# Patient Record
Sex: Male | Born: 2000 | Race: White | Hispanic: No | Marital: Single | Smoking: Never smoker
Health system: Southern US, Community
[De-identification: ages and names within clinical notes are randomized; demographics above are authoritative.]

## PROBLEM LIST (undated history)

## (undated) DIAGNOSIS — J302 Other seasonal allergic rhinitis: Secondary | ICD-10-CM

---
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
## 2021-09-07 ENCOUNTER — Other Ambulatory Visit: Payer: Self-pay

## 2021-09-07 DIAGNOSIS — M5431 Sciatica, right side: Secondary | ICD-10-CM | POA: Insufficient documentation

## 2021-09-07 NOTE — ED Triage Notes (Addendum)
Pt via POV from home c/o right leg pain x a few weeks. He went to [facility] last week and was told that he may have mild scoliosis with nerve pain, so they prescribed steroids and meloxicam and advised him to come to the ED for eval if his pain did not resolve. He has tried heat and OTC remedies with little to no relief. Currently rates pain 9/10 and describes the pain as sharp and stabbing. He appears uncomfortable in triage. Mom at bedside is assisting with history. Today he has had a total 3000mg  tylenol today along with biofreeze and 15mg  meloxicam. Last dose of 1000mg  tylenol was taken around 9pm. Pt notes his pain seems to be worse if he sits or stands for long periods of time and says it goes from the top of his gluteal muscle down to the bottom of his heel on the right side.

## 2021-09-08 ENCOUNTER — Emergency Department
Admission: EM | Admit: 2021-09-08 | Discharge: 2021-09-08 | Disposition: A | Discharge status: Home / Self Care | Payer: Commercial Managed Care - PPO | Attending: Emergency Medicine | Admitting: Emergency Medicine

## 2021-09-08 DIAGNOSIS — M5431 Sciatica, right side: Secondary | ICD-10-CM

## 2021-09-08 MED ORDER — OXYCODONE-ACETAMINOPHEN 5-325 MG PO TABS: 2.0000 | ORAL_TABLET | Freq: Three times a day (TID) | ORAL | 0 refills | Status: AC | PRN

## 2021-09-08 MED ORDER — KETOROLAC TROMETHAMINE 30 MG/ML IJ SOLN
30.0000 mg | Freq: Once | INTRAMUSCULAR | Status: AC
  Administered 2021-09-08: 02:00:00 30 mg via INTRAMUSCULAR
  Filled 2021-09-08: qty 1

## 2021-09-08 MED ORDER — OXYCODONE-ACETAMINOPHEN 5-325 MG PO TABS
2.0000 | ORAL_TABLET | Freq: Once | ORAL | Status: AC
  Administered 2021-09-08: 02:00:00 2 via ORAL
  Filled 2021-09-08: qty 2

## 2021-09-08 MED ORDER — LIDOCAINE 5 % EX PTCH: 1.0000 | MEDICATED_PATCH | Freq: Two times a day (BID) | CUTANEOUS | 0 refills | Status: AC

## 2021-09-08 NOTE — ED Provider Notes (Signed)
[facility] Emergency Department Provider Note  ____________________________________________   Event Date/Time   First MD Initiated Contact with Patient 09/08/21 0020     (approximate)  I have reviewed the triage vital signs and the nursing notes.   HISTORY  Chief Complaint Leg Pain    HPI Johnathan Powell is a 20 y.o. male who presents for evaluation of pain in his right buttock radiating all the way down his leg to his foot.  He has seen [facility] recently and was diagnosed with "nerve pain".  They prescribed a steroid taper but he was not getting much success so he called them back and they also prescribed meloxicam.  Neither seem to work.  He has also been taking acetaminophen regularly to no avail.  He said the pain is an aching and burning pain that starts in the upper part of his right buttock and radiates all the way down the back of his leg.  No weakness but any amount of moving around or changing positions seems to make him more uncomfortable.  He works with [employer] and the activity and movement, as well as periods where he has to remain still, seem to aggravate the pain.  Extending his leg out fully and straight makes it worse, nothing in particular makes it better.  He describes the pain as severe.  No recent trauma.  He denies fever, sore throat, chest pain, shortness of breath, nausea, vomiting, abdominal pain, and dysuria.  He has had no urinary urgency, urinary incontinence, nor urinary retention.  No loss of bowel control.       Past Medical History:  Diagnosis Date   Seasonal allergies     There are no problems to display for this patient.   No past surgical history on file.  Prior to Admission medications   Medication Sig Start Date End Date Taking? Authorizing Provider  lidocaine (LIDODERM) 5 % Place 1 patch onto the skin every 12 (twelve) hours. Remove & Discard patch within 12 hours or as directed by MD.  Put the patch on  the area where the pain originates.  Leave the patch off for 12 hours before applying a new one. 09/08/21 09/08/22 Yes Hinda Kehr, MD  oxyCODONE-acetaminophen (PERCOCET) 5-325 MG tablet Take 2 tablets by mouth every 8 (eight) hours as needed for severe pain. 09/08/21  Yes Hinda Kehr, MD  oxyCODONE-acetaminophen (PERCOCET) 5-325 MG tablet Take 2 tablets by mouth every 8 (eight) hours as needed for severe pain. 09/08/21  Yes Hinda Kehr, MD  loratadine (CLARITIN) 10 MG tablet Take 10 mg by mouth daily.    [provider]  loratadine (CLARITIN) 10 MG tablet Take 10 mg by mouth daily.    [provider]  trimethoprim-polymyxin b (POLYTRIM) ophthalmic solution Place 1 drop into the left eye every 4 (four) hours. 01/31/14   Kandra Nicolas, MD    Allergies Patient has no known allergies.  Family History  Problem Relation Age of Onset   Crohn's disease Mother     Social History Social History   Tobacco Use   Smoking status: Never  Substance Use Topics   Alcohol use: No   Drug use: No    Review of Systems Constitutional: No fever/chills Eyes: No visual changes. ENT: No sore throat. Cardiovascular: Denies chest pain. Respiratory: Denies shortness of breath. Gastrointestinal: No abdominal pain.  No nausea, no vomiting.  No diarrhea.  No constipation.  Negative for bowel incontinence. Genitourinary: Negative for dysuria, urinary  incontinence, and urinary retention. Musculoskeletal: Pain radiating down the top of his right buttock to his foot. Integumentary: Negative for rash. Neurological: No focal weakness.   ____________________________________________   PHYSICAL EXAM:  VITAL SIGNS: ED Triage Vitals  Enc Vitals Group     BP 09/07/21 2257 129/75     Pulse Rate 09/07/21 2257 60     Resp 09/07/21 2257 13     Temp 09/07/21 2257 97.9 F (36.6 C)     Temp Source 09/07/21 2257 Oral     SpO2 09/07/21 2257 97 %     Weight 09/07/21 2248 59 kg (130 lb)      Height 09/07/21 2248 1.778 m (5\' 10" )     Head Circumference --      Peak Flow --      Pain Score 09/07/21 2248 9     Pain Loc --      Pain Edu? --      Excl. in GC? --     Constitutional: Alert and oriented.  Appears uncomfortable but not in severe distress. Eyes: Conjunctivae are normal.  Head: Atraumatic. Nose: No congestion/rhinnorhea. Mouth/Throat: Patient is wearing a mask. Neck: No stridor.  No meningeal signs.   Cardiovascular: Normal rate, regular rhythm. Good peripheral circulation. Respiratory: Normal respiratory effort.  No retractions. Gastrointestinal: Soft and nontender. No distention.  Musculoskeletal: No lower extremity tenderness nor edema. No gross deformities of extremities. Neurologic:  Normal speech and language. No gross focal neurologic deficits are appreciated.  Worsening pain with right straight leg raise.  No focal weakness. Skin:  Skin is warm, dry and intact. Psychiatric: Mood and affect are normal. Speech and behavior are normal.  ____________________________________________    INITIAL IMPRESSION / MDM / ASSESSMENT AND PLAN / ED COURSE  As part of my medical decision making, I reviewed the following data within the electronic MEDICAL RECORD NUMBER History obtained from family, Nursing notes reviewed and incorporated, Old chart reviewed, Notes from prior ED visits, and [place] Controlled Substance Database   Differential diagnosis includes, but is not limited to, sciatica, cauda equina syndrome, nonspecific herniated disc, musculoskeletal strain, transverse myelitis, osteomyelitis/discitis.  Patient is generally well-appearing, uncomfortable but not in severe distress.  His symptoms are classic for sciatica.  No history of trauma.  No warning signs or symptoms to suggest cord compression requiring emergent surgery or cauda equina syndrome.  He has already tried NSAIDs and steroids.  He is done with prednisone.  I had my usual discussion about sciatica with the  patient and his mother who is a 09[birth year]09/21 and is at bedside.  We agreed for short course of Percocet and I am suggesting that he follow-up with Dr. Engineer, civil (consulting) with neurosurgery as well as Dr. Myer Haff with physiatry.  I gave my usual and customary management recommendations and return precautions and he and his mother understand and agree with the plan.              ____________________________________________  FINAL CLINICAL IMPRESSION(S) / ED DIAGNOSES  Final diagnoses:  Sciatica of right side     MEDICATIONS GIVEN DURING THIS VISIT:  Medications  oxyCODONE-acetaminophen (PERCOCET/ROXICET) 5-325 MG per tablet 2 tablet (2 tablets Oral Given 09/08/21 0208)  ketorolac (TORADOL) 30 MG/ML injection 30 mg (30 mg Intramuscular Given 09/08/21 0209)     ED Discharge Orders          Ordered    oxyCODONE-acetaminophen (PERCOCET) 5-325 MG tablet  Every 8 hours PRN        09/08/21  0118    lidocaine (LIDODERM) 5 %  Every 12 hours        09/08/21 0118    oxyCODONE-acetaminophen (PERCOCET) 5-325 MG tablet  Every 8 hours PRN        09/08/21 0218             Note:  This document was prepared using Dragon voice recognition software and may include unintentional dictation errors.   Hinda Kehr, MD 09/08/21 [phone]

## 2021-09-08 NOTE — Discharge Instructions (Signed)
You were evaluated in the Emergency Department today for back pain. Your evaluation suggests no acute abnormalities which require further intervention at this time.   - Move around as tolerated but avoiding heavy lifting. Bed rest is not recommended nor is it the best treatment for low back pain.  - Medications will help control your discomfort, but likely will not make it completely go away.  -- Do not drink alcohol, drive a car, operate machinery, or get up on ladders or heights when taking any prescribed pain medications.  -- Do not drive home if you received prescribed pain medications here in the ED.  Please follow up with your primary care physician as needed or any other providers listed in this paperwork. If you do not have a primary doctor, you can call your insurance company to find one.  If you do not have insurance, you can go to the finance/registration department for more assistance.  Return to the ED immediately if you develop any of the following problems: -- Leaking urine or difficulty urinating; -- Inability to control your bowels; -- New numbness or weakness in your legs or numbness between your legs; -- Inability to walk -- Fever

## 2021-09-12 ENCOUNTER — Other Ambulatory Visit: Payer: Self-pay | Admitting: Family Medicine

## 2021-09-12 DIAGNOSIS — M5416 Radiculopathy, lumbar region: Secondary | ICD-10-CM

## 2021-09-12 DIAGNOSIS — M5136 Other intervertebral disc degeneration, lumbar region: Secondary | ICD-10-CM

## 2021-09-19 ENCOUNTER — Ambulatory Visit
Admission: RE | Admit: 2021-09-19 | Discharge: 2021-09-19 | Disposition: A | Discharge status: Home / Self Care | Payer: Commercial Managed Care - PPO | Source: Ambulatory Visit | Attending: Family Medicine | Admitting: Family Medicine

## 2021-09-19 DIAGNOSIS — M5416 Radiculopathy, lumbar region: Secondary | ICD-10-CM

## 2021-09-19 DIAGNOSIS — M5136 Other intervertebral disc degeneration, lumbar region: Secondary | ICD-10-CM

## 2022-06-03 ENCOUNTER — Ambulatory Visit
Admission: RE | Admit: 2022-06-03 | Discharge: 2022-06-03 | Disposition: A | Discharge status: Home / Self Care | Payer: Commercial Managed Care - PPO | Source: Ambulatory Visit | Attending: Urgent Care | Admitting: Urgent Care

## 2022-06-03 VITALS — BP 110/69 | HR 65 | Temp 97.7°F | Resp 16

## 2022-06-03 DIAGNOSIS — Z20822 Contact with and (suspected) exposure to covid-19: Secondary | ICD-10-CM | POA: Diagnosis not present

## 2022-06-03 DIAGNOSIS — J069 Acute upper respiratory infection, unspecified: Secondary | ICD-10-CM | POA: Insufficient documentation

## 2022-06-03 LAB — RESP PANEL BY RT-PCR (RSV, FLU A&B, COVID)  RVPGX2
Influenza A by PCR: NEGATIVE
Influenza B by PCR: NEGATIVE
Resp Syncytial Virus by PCR: NEGATIVE
SARS Coronavirus 2 by RT PCR: NEGATIVE

## 2022-06-03 NOTE — ED Triage Notes (Signed)
Pt. States that  last night he started experiencing cough, congestion, emesis episodes. Bodyaches and a headache. Pt. Has been treating himself with Ibuprofen and no relief.

## 2022-06-03 NOTE — ED Provider Notes (Addendum)
Johnathan Powell    CSN: [id number] Arrival date & time: 06/03/22  0855      History   Chief Complaint Chief Complaint  Patient presents with   Generalized Body Aches   Cough   Headache   Nasal Congestion   apponment    Emesis    HPI Johnathan Powell is a 21 y.o. male.    Cough Associated symptoms: headaches   Headache Associated symptoms: cough and vomiting   Emesis Associated symptoms: cough and headaches     Patient presents to UC with complaint of flulike symptoms starting last night.  He endorses nasal congestion not productive of mucus and no rhinorrhea, nausea with vomiting and multiple episodes of emesis last night and this morning, body aches, severe headache.  Self treating with ibuprofen (800 mg) without relief.  Past Medical History:  Diagnosis Date   Seasonal allergies     There are no problems to display for this patient.   No past surgical history on file.     Home Medications    Prior to Admission medications   Medication Sig Start Date End Date Taking? Authorizing Provider  lidocaine (LIDODERM) 5 % Place 1 patch onto the skin every 12 (twelve) hours. Remove & Discard patch within 12 hours or as directed by MD.  Put the patch on the area where the pain originates.  Leave the patch off for 12 hours before applying a new one. 09/08/21 09/08/22  Loleta Rose, MD  loratadine (CLARITIN) 10 MG tablet Take 10 mg by mouth daily.    [provider]  loratadine (CLARITIN) 10 MG tablet Take 10 mg by mouth daily.    [provider]  oxyCODONE-acetaminophen (PERCOCET) 5-325 MG tablet Take 2 tablets by mouth every 8 (eight) hours as needed for severe pain. 09/08/21   Loleta Rose, MD  oxyCODONE-acetaminophen (PERCOCET) 5-325 MG tablet Take 2 tablets by mouth every 8 (eight) hours as needed for severe pain. 09/08/21   Loleta Rose, MD  trimethoprim-polymyxin b (POLYTRIM) ophthalmic solution Place 1 drop into the left eye every 4  (four) hours. 01/31/14   Lattie Haw, MD    Family History Family History  Problem Relation Age of Onset   Crohn's disease Mother     Social History Social History   Tobacco Use   Smoking status: Never  Substance Use Topics   Alcohol use: No   Drug use: No     Allergies   Patient has no known allergies.   Review of Systems Review of Systems  Respiratory:  Positive for cough.   Gastrointestinal:  Positive for vomiting.  Neurological:  Positive for headaches.     Physical Exam Triage Vital Signs ED Triage Vitals  Enc Vitals Group     BP      Pulse      Resp      Temp      Temp src      SpO2      Weight      Height      Head Circumference      Peak Flow      Pain Score      Pain Loc      Pain Edu?      Excl. in GC?    No data found.  Updated Vital Signs There were no vitals taken for this visit.  Visual Acuity Right Eye Distance:   Left Eye Distance:   Bilateral Distance:    Right  Eye Near:   Left Eye Near:    Bilateral Near:     Physical Exam Vitals reviewed.  Constitutional:      Appearance: He is well-developed. He is ill-appearing.  HENT:     Right Ear: Tympanic membrane normal.     Left Ear: Tympanic membrane normal.     Mouth/Throat:     Mouth: Mucous membranes are moist.     Pharynx: Posterior oropharyngeal erythema present. No oropharyngeal exudate.  Cardiovascular:     Rate and Rhythm: Normal rate and regular rhythm.  Pulmonary:     Effort: Pulmonary effort is normal.     Breath sounds: Normal breath sounds.  Abdominal:     General: Bowel sounds are normal.     Palpations: Abdomen is soft.  Skin:    General: Skin is warm and dry.  Neurological:     Mental Status: He is alert and oriented to person, place, and time.  Psychiatric:        Mood and Affect: Mood normal.        Behavior: Behavior normal.      UC Treatments / Results  Labs (all labs ordered are listed, but only abnormal results are displayed) Labs  Reviewed - No data to display  EKG   Radiology No results found.  Procedures Procedures (including critical care time)  Medications Ordered in UC Medications - No data to display  Initial Impression / Assessment and Plan / UC Course  I have reviewed the triage vital signs and the nursing notes.  Pertinent labs & imaging results that were available during my care of the patient were reviewed by me and considered in my medical decision making (see chart for details).   Suspect viral URI.  Flu/COVID/RSV swab was obtained and pending.  Ill appearing.  Physical exam is remarkable only for erythematous pharynx.  Recommended treatment with OTC medication for symptom control.  Encouraged him to use a lower dose of ibuprofen to avoid irritation to his stomach.  Final Clinical Impressions(s) / UC Diagnoses   Final diagnoses:  None   Discharge Instructions   None    ED Prescriptions   None    PDMP not reviewed this encounter.   Rose Phi, [place] 06/03/22 [place], [place], [place] 06/03/22 [id number]

## 2022-06-03 NOTE — Discharge Instructions (Addendum)
Follow-up here or with your primary care provider if your symptoms worsen or do not resolve within 7 days.

## 2022-07-18 IMAGING — MR MR LUMBAR SPINE W/O CM
5 series · 38 of 48 positions shown · non-contrast
Comparison: None.

CLINICAL DATA: 20-year-old male with low back pain radiating to the
right leg.

EXAM:
MRI LUMBAR SPINE WITHOUT CONTRAST
TECHNIQUE: Multiplanar, multisequence MR imaging of the lumbar spine was
performed. No intravenous contrast was administered.

[Series 3: T1 · sagittal · 4.0mm · 0.41mm/px · 4 of 11 slices shown (1 of 2)]
[im 1/11]
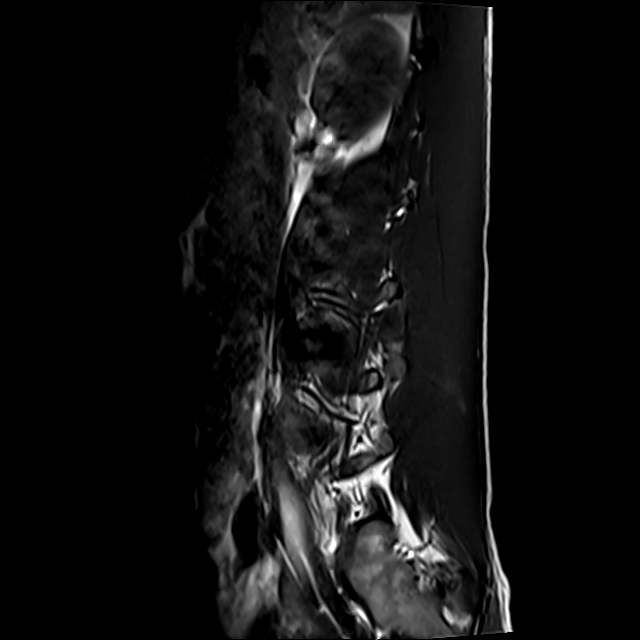
[im 4/11]
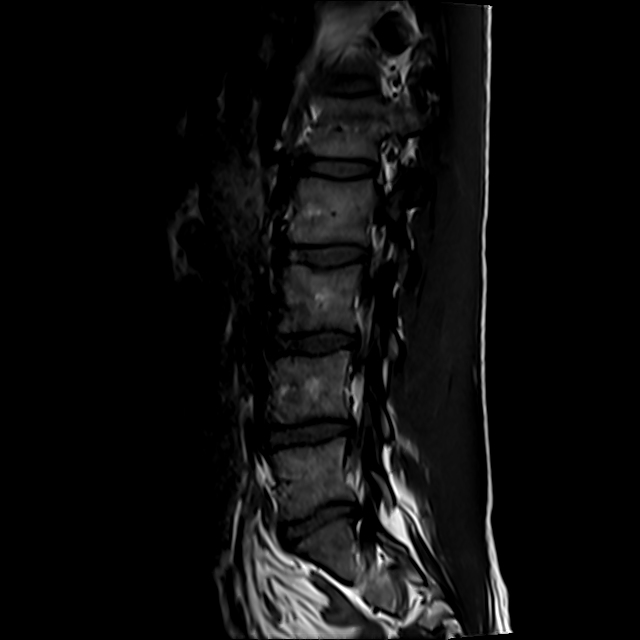
[im 7/11]
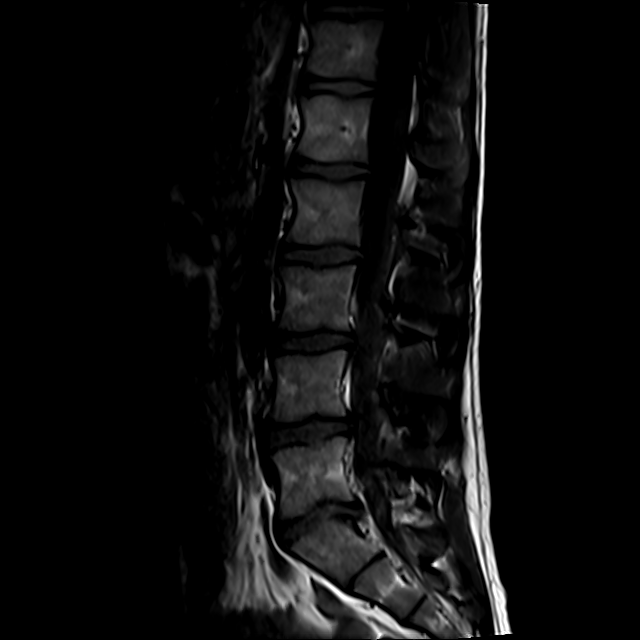
[im 11/11]
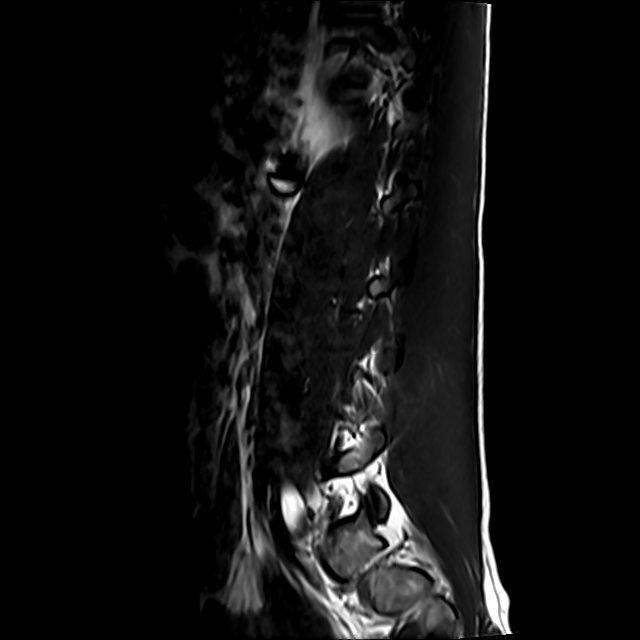

[Series 4: STIR · sagittal · 4.0mm · 0.51mm/px · 5 of 11 slices shown]
[im 1/11]
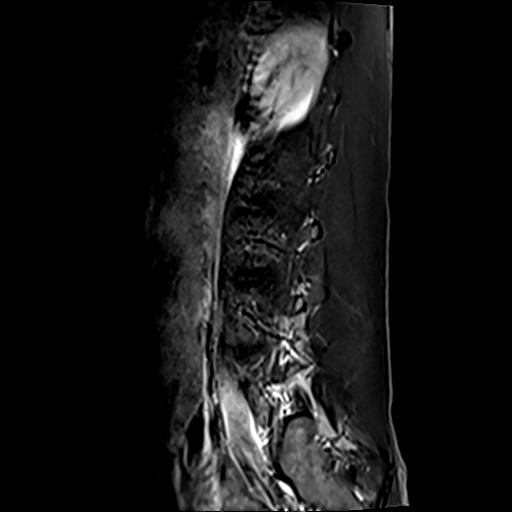
[im 3/11]
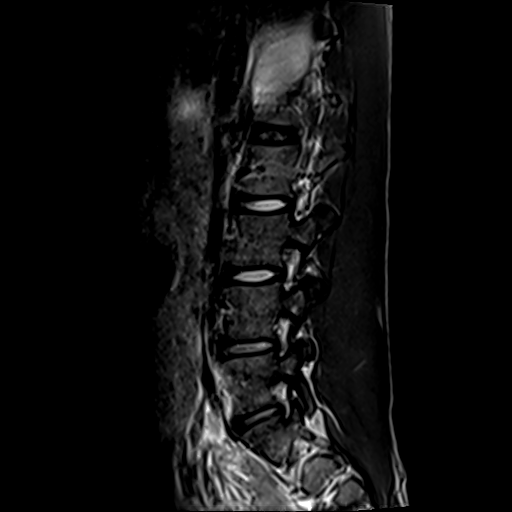
[im 6/11]
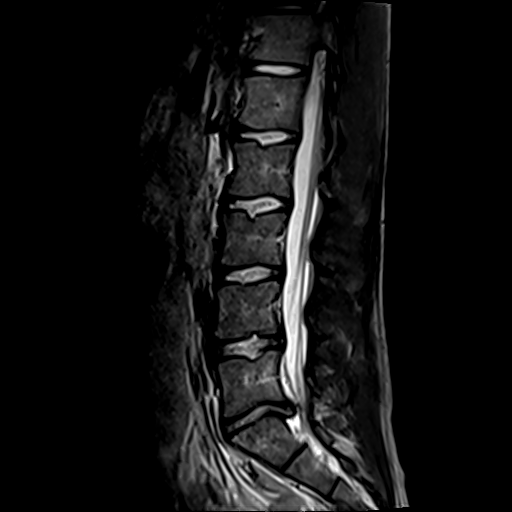
[im 8/11]
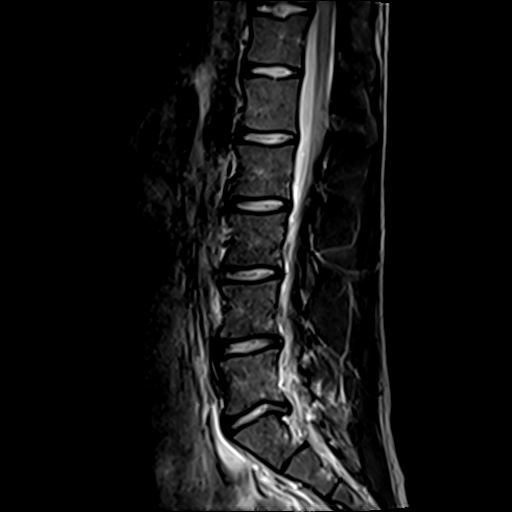
[im 11/11]
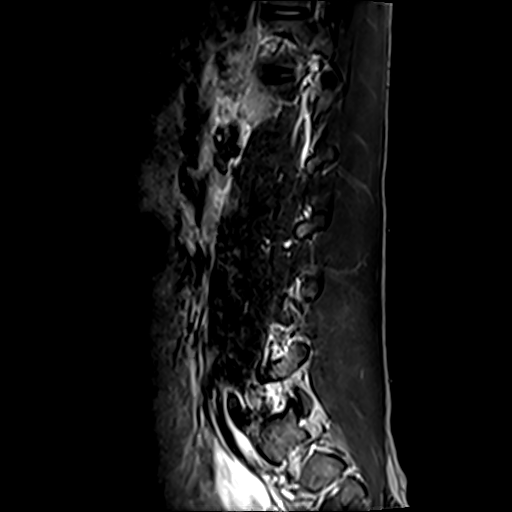

[Series 5: T2 · sagittal · 4.0mm · 1.02mm/px · 5 of 11 slices shown (1 of 2)]
[im 1/11]
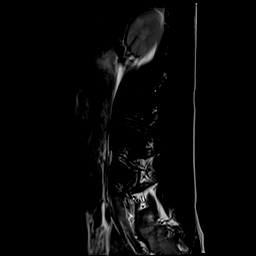
[im 3/11]
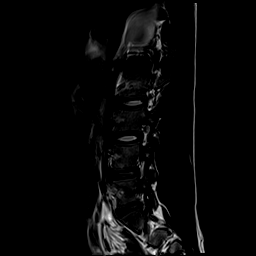
[im 6/11]
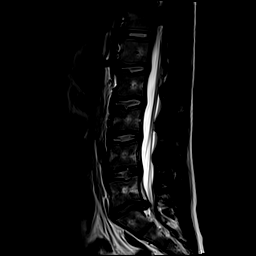
[im 8/11]
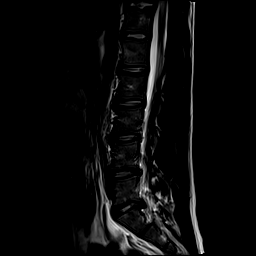
[im 11/11]
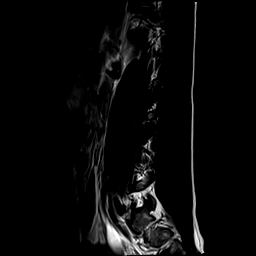

[Series 6: T1 · axial · 4.0mm · 0.78mm/px · z∈[-182,+3]mm · 11 of 41 slices shown (2 of 2)]
[im 3/41]
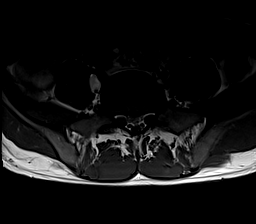
[im 6/41]
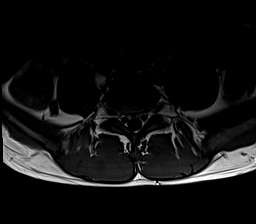
[im 8/41]
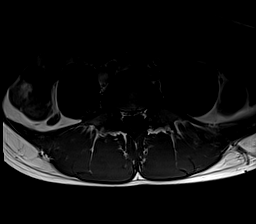
[im 13/41]
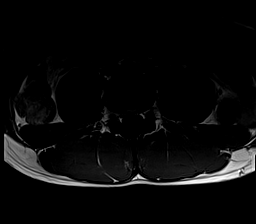
[im 18/41]
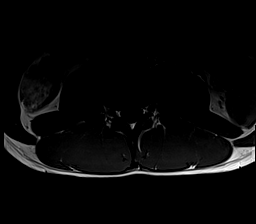
[im 21/41]
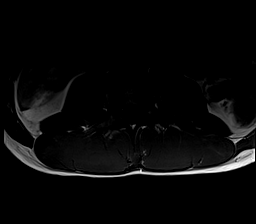
[im 23/41]
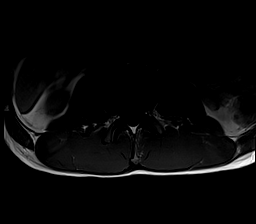
[im 28/41]
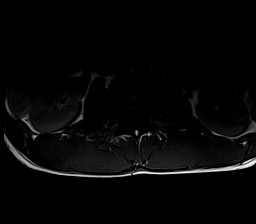
[im 33/41]
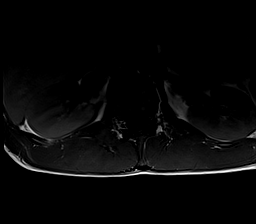
[im 36/41]
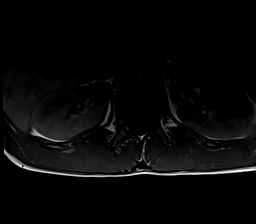
[im 38/41]
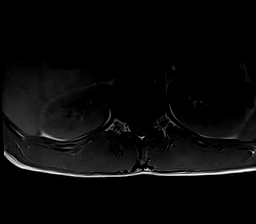

[Series 7: T2 · axial · 4.0mm · 0.78mm/px · z∈[-193,-2]mm · 13 of 39 slices shown (2 of 2)]
[im 1/39]
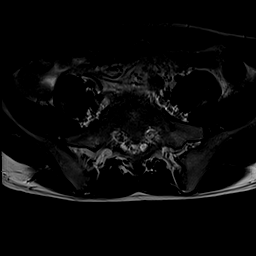
[im 3/39]
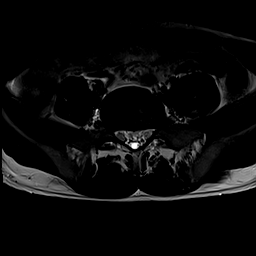
[im 5/39]
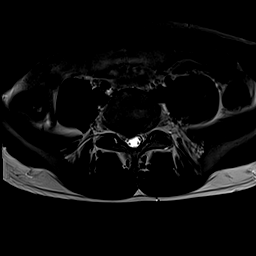
[im 8/39]
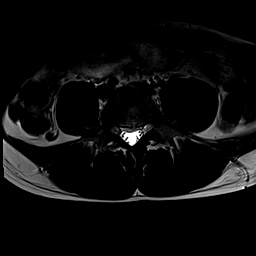
[im 10/39]
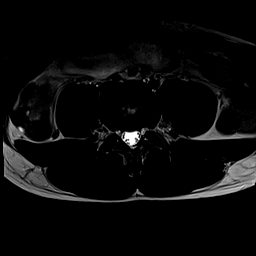
[im 12/39]
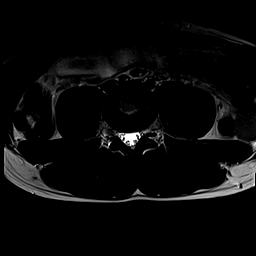
[im 17/39]
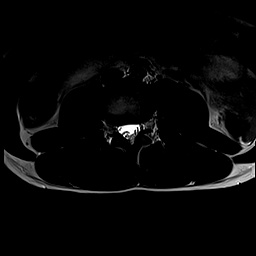
[im 20/39]
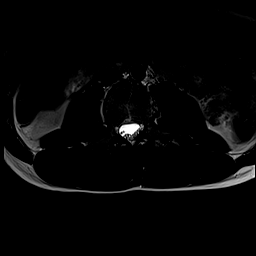
[im 22/39]
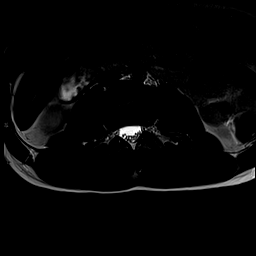
[im 27/39]
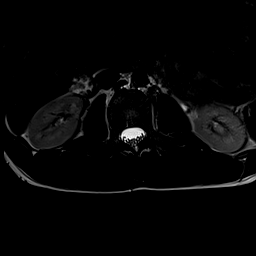
[im 31/39]
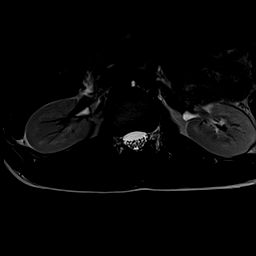
[im 34/39]
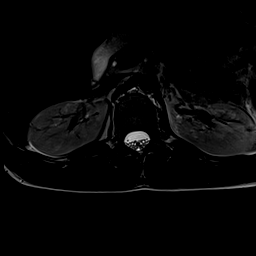
[im 36/39]
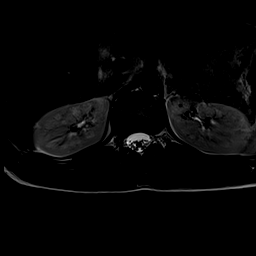

[38 of 48 positions shown; findings below may reference images not displayed]

FINDINGS: Segmentation: Lumbar segmentation appears to be normal and will be
designated as such for this report.

Alignment: Straightening of lumbar lordosis with mild dextroconvex
lumbar scoliosis. No spondylolisthesis.

Vertebrae: Visualized bone marrow signal is within normal limits. No
marrow edema or evidence of acute osseous abnormality. Intact
visible sacrum.

Conus medullaris and cauda equina: Conus extends to the T12-L1
level. No lower spinal cord or conus signal abnormality.

Paraspinal and other soft tissues: Negative.

Disc levels:

T11-T12: Partially visible, grossly negative.

T12-L1:  Negative.

L1-L2:  Negative.

L2-L3:  Negative.

L3-L4:  Negative.

L4-L5:  Negative.

L5-S1: Subtle disc space loss and disc desiccation. Broad-based
right paracentral disc protrusion most affecting the right lateral
recess (series 7, image 35, descending right S1 nerve level). Up to
moderate lateral recess stenosis but no significant spinal stenosis.
No foraminal involvement or stenosis.
IMPRESSION: Isolated lumbar disc degeneration at L5-S1 with a broad-based
rightward disc herniation affecting the right lateral recess. Query
Right S1 radiculitis.

## 2022-11-05 ENCOUNTER — Telehealth: Payer: Commercial Managed Care - PPO | Admitting: Nurse Practitioner

## 2022-11-05 DIAGNOSIS — H66002 Acute suppurative otitis media without spontaneous rupture of ear drum, left ear: Secondary | ICD-10-CM | POA: Diagnosis not present

## 2022-11-05 MED ORDER — AMOXICILLIN-POT CLAVULANATE 875-125 MG PO TABS: 1.0000 | ORAL_TABLET | Freq: Two times a day (BID) | ORAL | 0 refills | Status: AC

## 2022-11-05 MED ORDER — FLUTICASONE PROPIONATE 50 MCG/ACT NA SUSP: 2.0000 | Freq: Every day | NASAL | 6 refills | Status: AC

## 2022-11-05 NOTE — Progress Notes (Signed)
Virtual Visit Consent   Johnathan Powell, you are scheduled for a virtual visit with a [facility] provider today. Just as with appointments in the office, your consent must be obtained to participate. Your consent will be active for this visit and any virtual visit you may have with one of our providers in the next 365 days. If you have a MyChart account, a copy of this consent can be sent to you electronically.  As this is a virtual visit, video technology does not allow for your provider to perform a traditional examination. This may limit your provider's ability to fully assess your condition. If your provider identifies any concerns that need to be evaluated in person or the need to arrange testing (such as labs, EKG, etc.), we will make arrangements to do so. Although advances in technology are sophisticated, we cannot ensure that it will always work on either your end or our end. If the connection with a video visit is poor, the visit may have to be switched to a telephone visit. With either a video or telephone visit, we are not always able to ensure that we have a secure connection.  By engaging in this virtual visit, you consent to the provision of healthcare and authorize for your insurance to be billed (if applicable) for the services provided during this visit. Depending on your insurance coverage, you may receive a charge related to this service.  I need to obtain your verbal consent now. Are you willing to proceed with your visit today? Kobyn D Lunney has provided verbal consent on 11/05/2022 for a virtual visit (video or telephone). Apolonio Schneiders, FNP  Date: 11/05/2022 7:41 PM  Virtual Visit via Video Note   I, Apolonio Schneiders, connected with  Johnathan Powell  ([id number], 03/21/2001) on 11/05/22 at  7:45 PM EST by a video-enabled telemedicine application and verified that I am speaking with the correct person using two identifiers.  Location: Patient: Virtual Visit Location Patient:  Home Provider: Virtual Visit Location Provider: Home Office   I discussed the limitations of evaluation and management by telemedicine and the availability of in person appointments. The patient expressed understanding and agreed to proceed.    History of Present Illness: Johnathan Powell is a 22 y.o. who identifies as a male who was assigned male at birth, and is being seen today for ongoing URI symptoms.  He was in seen in office last week, tested negative for flu and COVID but was treated for flu due to high suspicion for flu   He has persisted with sore throat and ear pain/pressure  Left ear pain is the worse today   Mild runny nose  Denies a recent fever   He has been using Excedrin for pain relief He has had ear infections in the past on average one a year     Problems: There are no problems to display for this patient.   Allergies: No Known Allergies Medications: None  Observations/Objective: Patient is well-developed, well-nourished in no acute distress.  Resting comfortably  at home.  Head is normocephalic, atraumatic.  No labored breathing.  Speech is clear and coherent with logical content.  Patient is alert and oriented at baseline.    Assessment and Plan: 1. Non-recurrent acute suppurative otitis media of left ear without spontaneous rupture of tympanic membrane May continue pain medications as needed, if no improvement in 48 hours please schedule follow up  - amoxicillin-clavulanate (AUGMENTIN) 875-125 MG tablet; Take 1 tablet by mouth  2 (two) times daily for 7 days. Take with food  Dispense: 14 tablet; Refill: 0 - fluticasone (FLONASE) 50 MCG/ACT nasal spray; Place 2 sprays into both nostrils daily.  Dispense: 16 g; Refill: 6     Follow Up Instructions: I discussed the assessment and treatment plan with the patient. The patient was provided an opportunity to ask questions and all were answered. The patient agreed with the plan and demonstrated an  understanding of the instructions.  A copy of instructions were sent to the patient via MyChart unless otherwise noted below.    The patient was advised to call back or seek an in-person evaluation if the symptoms worsen or if the condition fails to improve as anticipated.  Time:  I spent 9 minutes with the patient via telehealth technology discussing the above problems/concerns.    Apolonio Schneiders, FNP

## 2023-01-18 ENCOUNTER — Emergency Department
Admission: EM | Admit: 2023-01-18 | Discharge: 2023-01-18 | Disposition: A | Discharge status: Home / Self Care | Payer: Commercial Managed Care - PPO | Attending: Emergency Medicine | Admitting: Emergency Medicine

## 2023-01-18 ENCOUNTER — Emergency Department: Payer: Commercial Managed Care - PPO

## 2023-01-18 DIAGNOSIS — J209 Acute bronchitis, unspecified: Secondary | ICD-10-CM | POA: Insufficient documentation

## 2023-01-18 DIAGNOSIS — D72829 Elevated white blood cell count, unspecified: Secondary | ICD-10-CM | POA: Insufficient documentation

## 2023-01-18 DIAGNOSIS — J40 Bronchitis, not specified as acute or chronic: Secondary | ICD-10-CM

## 2023-01-18 DIAGNOSIS — Z716 Tobacco abuse counseling: Secondary | ICD-10-CM | POA: Insufficient documentation

## 2023-01-18 DIAGNOSIS — R062 Wheezing: Secondary | ICD-10-CM

## 2023-01-18 DIAGNOSIS — R0602 Shortness of breath: Secondary | ICD-10-CM | POA: Diagnosis present

## 2023-01-18 LAB — COMPREHENSIVE METABOLIC PANEL
ALT: 15 U/L (ref 0–44)
AST: 29 U/L (ref 15–41)
Albumin: 4.2 g/dL (ref 3.5–5.0)
Alkaline Phosphatase: 95 U/L (ref 38–126)
Anion gap: 7 (ref 5–15)
BUN: 13 mg/dL (ref 6–20)
CO2: 27 mmol/L (ref 22–32)
Calcium: 8.7 mg/dL — ABNORMAL LOW (ref 8.9–10.3)
Chloride: 104 mmol/L (ref 98–111)
Creatinine, Ser: 0.82 mg/dL (ref 0.61–1.24)
GFR, Estimated: >60 mL/min (ref >60)
Glucose, Bld: 96 mg/dL (ref 70–99)
Potassium: 4.1 mmol/L (ref 3.5–5.1)
Sodium: 138 mmol/L (ref 135–145)
Total Bilirubin: 0.8 mg/dL (ref 0.3–1.2)
Total Protein: 6.7 g/dL (ref 6.5–8.1)

## 2023-01-18 LAB — CBC WITH DIFFERENTIAL/PLATELET
Abs Immature Granulocytes: 0.03 10*3/uL (ref 0.00–0.07)
Basophils Absolute: 0.2 10*3/uL — ABNORMAL HIGH (ref 0.0–0.1)
Basophils Relative: 2 %
Eosinophils Absolute: 1.3 10*3/uL — ABNORMAL HIGH (ref 0.0–0.5)
Eosinophils Relative: 12 %
HCT: 46.8 % (ref 39.0–52.0)
Hemoglobin: 16 g/dL (ref 13.0–17.0)
Immature Granulocytes: 0 %
Lymphocytes Relative: 23 %
Lymphs Abs: 2.6 10*3/uL (ref 0.7–4.0)
MCH: 30.4 pg (ref 26.0–34.0)
MCHC: 34.2 g/dL (ref 30.0–36.0)
MCV: 89 fL (ref 80.0–100.0)
Monocytes Absolute: 0.8 10*3/uL (ref 0.1–1.0)
Monocytes Relative: 7 %
Neutro Abs: 6.2 10*3/uL (ref 1.7–7.7)
Neutrophils Relative %: 56 %
Platelets: 284 10*3/uL (ref 150–400)
RBC: 5.26 MIL/uL (ref 4.22–5.81)
RDW: 12.3 % (ref 11.5–15.5)
WBC: 11.1 10*3/uL — ABNORMAL HIGH (ref 4.0–10.5)
nRBC: 0 % (ref 0.0–0.2)

## 2023-01-18 LAB — TROPONIN I (HIGH SENSITIVITY): Troponin I (High Sensitivity): 4 ng/L (ref <18)

## 2023-01-18 MED ORDER — AEROCHAMBER MV MISC: 0 refills | Status: AC

## 2023-01-18 MED ORDER — NICOTINE POLACRILEX 4 MG MT LOZG: 4.0000 mg | LOZENGE | OROMUCOSAL | 0 refills | Status: AC | PRN

## 2023-01-18 MED ORDER — IPRATROPIUM-ALBUTEROL 0.5-2.5 (3) MG/3ML IN SOLN
9.0000 mL | Freq: Once | RESPIRATORY_TRACT | Status: AC
  Administered 2023-01-18: 9 mL via RESPIRATORY_TRACT
  Filled 2023-01-18: qty 9

## 2023-01-18 MED ORDER — NICOTINE 7 MG/24HR TD PT24: 7.0000 mg | MEDICATED_PATCH | Freq: Every day | TRANSDERMAL | 3 refills | Status: AC

## 2023-01-18 MED ORDER — PREDNISONE 50 MG PO TABS: ORAL_TABLET | ORAL | 0 refills | Status: AC

## 2023-01-18 MED ORDER — ALBUTEROL SULFATE HFA 108 (90 BASE) MCG/ACT IN AERS: 2.0000 | INHALATION_SPRAY | RESPIRATORY_TRACT | 2 refills | Status: AC | PRN

## 2023-01-18 MED ORDER — PREDNISONE 20 MG PO TABS
60.0000 mg | ORAL_TABLET | Freq: Once | ORAL | Status: AC
  Administered 2023-01-18: 60 mg via ORAL
  Filled 2023-01-18: qty 3

## 2023-01-18 NOTE — ED Triage Notes (Signed)
Ambulatory to triage with c/o sob x 2 weeks. Reports productive cough with green mucous. Denies fever.  States tonight the feeling of not being able to catch his breath woke him up from his sleep.  Pt reports smoking Delta 8 vapes, which seems to make exacerbate sx each time he uses it. Last smoked it last night around 8pm.

## 2023-01-18 NOTE — ED Provider Notes (Signed)
[facility] Provider Note    Event Date/Time   First MD Initiated Contact with Patient 01/18/23 [phone]     (approximate)   History   Shortness of Breath   HPI  Johnathan Powell is a 22 y.o. 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  Worse when exerting himself.  No chest pain.  Denies IV drug use.  He has had no fever or chills.  No history of asthma.  Independent Historian contributed to assessment above: His mother is at bedside corroborates information past medical history given above   Physical Exam   Triage Vital Signs: ED Triage Vitals  Enc Vitals Group     BP 01/18/23 0150 (!) 131/93     Pulse Rate 01/18/23 0150 85     Resp 01/18/23 0150 18     Temp 01/18/23 0150 97.9 F (36.6 C)     Temp Source 01/18/23 0150 Oral     SpO2 01/18/23 0150 99 %     Weight 01/18/23 0150 130 lb (59 kg)     Height 01/18/23 0150 5\' 10"  (1.778 m)     Head Circumference --      Peak Flow --      Pain Score 01/18/23 0152 4     Pain Loc --      Pain Edu? --      Excl. in GC? --     Most recent vital signs: Vitals:   01/18/23 0150  BP: (!) 131/93  Pulse: 85  Resp: 18  Temp: 97.9 F (36.6 C)  SpO2: 99%    General: Awake, no distress.  CV:  Good peripheral perfusion.  Resp:  Normal effort.  Abd:  No distention.  Other:  No respiratory distress but he does have diffuse moderate wheezing in all lung fields.  No focality.  No fever.  No hypoxemia.   ED Results / Procedures / Treatments   Labs (all labs ordered are listed, but only abnormal results are displayed) Labs Reviewed  CBC WITH DIFFERENTIAL/PLATELET - Abnormal; Notable for the following components:      Result Value   WBC 11.1 (*)    Eosinophils Absolute 1.3 (*)    Basophils Absolute 0.2 (*)    All other components  within normal limits  COMPREHENSIVE METABOLIC PANEL - Abnormal; Notable for the following components:   Calcium 8.7 (*)    All other components within normal limits  TROPONIN I (HIGH SENSITIVITY)  TROPONIN I (HIGH SENSITIVITY)     I ordered and reviewed the above labs they are notable for white blood cell count is mildly elevated 11.1  EKG  ED ECG REPORT I, Pilar Jarvis, the attending physician, personally viewed and interpreted this ECG.   Date: 01/18/2023  EKG Time: 0149  Rate: 85  Rhythm: nsr  Axis: nl  Intervals:incomplete RBBB  ST&T Change: no stemi    RADIOLOGY I independently reviewed and interpreted cxr and I see no obvious focality pneumothorax   PROCEDURES:  Critical Care performed: No  Procedures   MEDICATIONS ORDERED IN ED: Medications  ipratropium-albuterol (DUONEB) 0.5-2.5 (3) MG/3ML nebulizer solution 9 mL (has no administration in time range)  predniSONE (DELTASONE) tablet 60 mg (has no administration in time range)    IMPRESSION / MDM / ASSESSMENT AND PLAN / ED COURSE  I reviewed  the triage vital signs and the nursing notes.                                Patient's presentation is most consistent with acute presentation with potential threat to life or bodily function.  Differential diagnosis includes, but is not limited to, bronchitis, viral URI, asthma or COPD exacerbation, bacterial pneumonia, sepsis PE   The patient is on the cardiac monitor to evaluate for evidence of arrhythmia and/or significant heart rate changes.  MDM: This is a patient with significant vaporizer cannabinoid and tobacco use who presents emergency department with several weeks of cough and exertional dyspnea.  He is wheezing in all lung fields.  He has no fever but mildly elevated white blood cell count no focality on chest x-ray so I do not think he has a bacterial infection..  I will treat with DuoNebs and steroids.  I will give him a prescription for prednisone  burst as well as albuterol inhaler.  He will follow-up with his PMD.    I spent 5 minutes counseling this patient on smoking cessation.  We spoke about the patient's current tobacco use, impact of smoking, assessed willingness to quit, methods for cessation including medical management and nicotine replacement therapy (which I prescribed to the patient) and advised follow-up with primary doctor to continue to address smoking cessation.    FINAL CLINICAL IMPRESSION(S) / ED DIAGNOSES   Final diagnoses:  Wheezing  Bronchitis  Encounter for smoking cessation counseling     Rx / DC Orders   ED Discharge Orders          Ordered    albuterol (VENTOLIN HFA) 108 (90 Base) MCG/ACT inhaler  Every 4 hours PRN        01/18/23 0646    Spacer/Aero-Holding Chambers (AEROCHAMBER MV) inhaler        01/18/23 0646    predniSONE (DELTASONE) 50 MG tablet        01/18/23 0646    nicotine polacrilex (NICOTINE MINI) 4 MG lozenge  As needed        01/18/23 0646    nicotine (NICODERM CQ - DOSED IN MG/24 HR) 7 mg/24hr patch  Daily        01/18/23 0646             Note:  This document was prepared using Dragon voice recognition software and may include unintentional dictation errors.    Pilar Jarvis, MD 01/18/23 [phone]

## 2023-01-18 NOTE — Discharge Instructions (Signed)
Take prednisone for the full course as prescribed.  Take your albuterol inhaler as prescribed for shortness of breath and wheezing.  Follow-up with your primary doctor this week for checkup.  If you develop any new, worsening, unexpected symptoms, back to the emergency department for recheck.
